# Patient Record
Sex: Female | Born: 1995 | Race: White | Hispanic: No | Marital: Single | Smoking: Never smoker
Health system: Southern US, Community
[De-identification: ages and names within clinical notes are randomized; demographics above are authoritative.]

## PROBLEM LIST (undated history)

## (undated) HISTORY — PX: OTHER SURGICAL HISTORY: SHX169

---

## 2015-03-01 ENCOUNTER — Ambulatory Visit (INDEPENDENT_AMBULATORY_CARE_PROVIDER_SITE_OTHER): Payer: Self-pay | Admitting: Family Medicine

## 2015-03-01 ENCOUNTER — Ambulatory Visit (INDEPENDENT_AMBULATORY_CARE_PROVIDER_SITE_OTHER): Payer: BLUE CROSS/BLUE SHIELD

## 2015-03-01 ENCOUNTER — Encounter: Payer: Self-pay | Admitting: Family Medicine

## 2015-03-01 VITALS — BP 132/83 | HR 82 | Ht 66.0 in | Wt 176.0 lb

## 2015-03-01 DIAGNOSIS — M25551 Pain in right hip: Secondary | ICD-10-CM | POA: Diagnosis not present

## 2015-03-01 NOTE — Progress Notes (Signed)
Taylor Salazar is a 19 y.o. female who presents to [facility]  today for right hip pain. Patient has a three-year history of right anterior hip pain. Pain is worse with activity especially hiking. She notes the hip flexion portion of hiking is quite painful. The pain is located in the anterior aspect of the hip. The pain does not radiate. She denies any significant locking or catching. She's tried some over-the-counter medicines for pain which helps some. No fevers chills nausea vomiting or diarrhea.   History reviewed. No pertinent past medical history. History reviewed. No pertinent past surgical history. History  Substance Use Topics  . Smoking status: Never Smoker   . Smokeless tobacco: Not on file  . Alcohol Use: Not on file   ROS as above Medications: Current Outpatient Prescriptions  Medication Sig Dispense Refill  . fexofenadine (ALLEGRA) 180 MG tablet Take 180 mg by mouth daily.    Marland Kitchen Fexofenadine HCl (ALLEGRA PO) Take by mouth.    . IRON PO Take by mouth.    . Potassium (POTASSIMIN PO) Take by mouth.     No current facility-administered medications for this visit.   Allergies  Allergen Reactions  . Penicillins Nausea And Vomiting     Exam:  BP 132/83 mmHg  Pulse 82  Ht  (1.676 m)  Wt 176 lb (79.833 kg)  BMI 28.42 kg/m2  LMP 01/29/2015 Gen: Well NAD HEENT: EOMI,  MMM Lungs: Normal work of breathing. CTABL Heart: RRR no MRG Abd: NABS, Soft. Nondistended, Nontender Exts: Brisk capillary refill, warm and well perfused.  MSK: Back nontender to midline normal range of motion Right hip normal-appearing tender palpation anterior aspect of the hip. Pain with hip range of motion especially flexion.  Range of motion  less than 5 limitation of flexion and external rotation compared to contralateral left side. Pain with flexion and external rotation. The locking or catching with hip range of motion. Strength is intact however pain with  resisted flexion  Limited musculoskeletal ultrasound of the right anterior hip: AIIS visualized. Hyperechoic change with increased Doppler activity present. Labrum visualize no obvious defects. Femoral head and neck visualized. Normal-appearing   No results found for this or any previous visit (from the past 24 hour(s)). Dg Hip Unilat With Pelvis 2-3 Views Right  03/01/2015   CLINICAL DATA:  Focal right hip pain for 2-3 years with no known injury  EXAM: DG HIP (WITH OR WITHOUT PELVIS) 2-3V RIGHT  COMPARISON:  None.  FINDINGS: No significant joint space narrowing or osteophyte formation. Normal rounded contour of femoral head with no abnormal attenuation. Pelvic bones intact.  IMPRESSION: Radiographically negative   Electronically Signed   By: Esperanza Heir M.D.   On: 03/01/2015 14:53     Please see individual assessment and plan sections.

## 2015-03-01 NOTE — Patient Instructions (Signed)
Thank you for coming in today. Get xray today.  Go to PT.  Return in 3-4 weeks for recheck.  Take up to 2 aleve twice daily for pain.

## 2015-03-01 NOTE — Progress Notes (Signed)
Quick Note:  Normal, no changes. ______ 

## 2015-03-01 NOTE — Assessment & Plan Note (Signed)
Great anterior hip pain chronically this point. At this point I suspect her pain is originating from the 8 IVS likely at the insertion of the rectus femoris. She has some change of the tendon insertion on ultrasound. Plan for physical therapy. If not better will consider MRI.

## 2015-03-09 ENCOUNTER — Encounter: Payer: Self-pay | Admitting: Rehabilitative and Restorative Service Providers"

## 2015-03-09 ENCOUNTER — Ambulatory Visit (INDEPENDENT_AMBULATORY_CARE_PROVIDER_SITE_OTHER): Payer: BLUE CROSS/BLUE SHIELD | Admitting: Rehabilitative and Restorative Service Providers"

## 2015-03-09 DIAGNOSIS — M25551 Pain in right hip: Secondary | ICD-10-CM | POA: Diagnosis not present

## 2015-03-09 DIAGNOSIS — R531 Weakness: Secondary | ICD-10-CM

## 2015-03-09 DIAGNOSIS — Z7409 Other reduced mobility: Secondary | ICD-10-CM | POA: Diagnosis not present

## 2015-03-09 DIAGNOSIS — M25651 Stiffness of right hip, not elsewhere classified: Secondary | ICD-10-CM

## 2015-03-09 DIAGNOSIS — R29898 Other symptoms and signs involving the musculoskeletal system: Secondary | ICD-10-CM | POA: Diagnosis not present

## 2015-03-09 NOTE — Therapy (Signed)
[facility] [place] [address] [place], [place], [address] Phone: [phone]   Fax:  [phone]  Physical Therapy Evaluation  Patient Details  Name: Taylor Salazar MRN: [id number] Date of Birth: [birth year] Referring Provider:  Rodolph Bong, MD  Encounter Date: 03/09/2015      PT End of Session - 03/09/15 1241    Visit Number 1   Number of Visits 12   Date for PT Re-Evaluation 04/20/15   PT Start Time 1101   PT Stop Time 1205   PT Time Calculation (min) 64 min   Activity Tolerance Patient tolerated treatment well;No increased pain      History reviewed. No pertinent past medical history.  History reviewed. No pertinent past surgical history.  There were no vitals filed for this visit.  Visit Diagnosis:  Hip pain, right - Plan: PT plan of care cert/re-cert  Stiffness of hip joint, right - Plan: PT plan of care cert/re-cert  Weakness of right hip - Plan: PT plan of care cert/re-cert  Decreased strength, endurance, and mobility - Plan: PT plan of care cert/re-cert      Subjective Assessment - 03/09/15 1109    Subjective Pain in the Rt anterior hip - only hurts with strenuous activities like walking for prolonged periods of time, hiking > 1/2 mile. Hurting since she went hiking last weekend. Has been a problem for ~ 3 years with no known cause   Pertinent History Mirgraines   How long can you sit comfortably? no limits   How long can you stand comfortably? 3 hours   How long can you walk comfortably? 20-30 min   Diagnostic tests xrays   Patient Stated Goals get rid of hip pain   Currently in Pain? Yes  pain anterior Rt hip with walking/hiking/lifting leg a lot  - resolves the following day but last episode lasted for ~1 week   Pain Score 0-No pain   Pain Location Hip   Pain Orientation Right   Pain Descriptors / Indicators Aching;Burning;Sharp;Stabbing   Pain Type Chronic pain   Aggravating Factors  tried adductor  squeeze at gym which irritated symptoms; hiking; walking; lifting leg   Pain Relieving Factors getting off leg            [facility] PT Assessment - 03/09/15 0001    Assessment   Medical Diagnosis Rt hip pain   Onset Date/Surgical Date --  ~3 years ago with episodic flare ups most recently ~ 1 week   Hand Dominance Right   Next MD Visit no return scheduled   Precautions   Precaution Comments avoid activities that cause pain   Balance Screen   Has the patient fallen in the past 6 months No   Has the patient had a decrease in activity level because of a fear of falling?  No   Is the patient reluctant to leave their home because of a fear of falling?  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   Leisure hiking; biking; gym    Observation/Other Assessments   Observations sits flexed forward with legs crossed Rt over Lt   Sensation   Additional Comments WNL's per patient report   Posture/Postural Control   Posture Comments  stands with knees hyperextended;   AROM   Right/Left Hip --  WFL except Rt tight hip flexors (-)20 deg supine testing   Lumbar Flexion 80%   Lumbar Extension 80%   Lumbar - Right Side Bend 80%   Lumbar - Left Side Bend 75%   Strength   Right/Left Hip --  Lt LE 5/5; Rt knee 5/5   Right Hip Flexion 4+/5   Right Hip Extension 4+/5   Right Hip ABduction 5/5   Right Hip ADduction 4+/5   Palpation   Palpation comment muscular tenderness/tightness through Rt anterior hip through hip flexors/adductors                    [facility] Adult PT Treatment/Exercise - 03/09/15 0001    Self-Care   Self-Care --  instructed in myofacial ball release work Rt hip anter/post   Knee/Hip Exercises: Dentist 3  reps;20 seconds   Hip Flexor Stretch 3 reps;20 seconds   Hip Flexor Stretch Limitations prone with hip extended using strap   Other Knee/Hip Stretches supine hip flexor stretch 60 sec hold 3 reps   Knee/Hip Exercises: Prone   Other Prone Exercises glut set 10 sec hold 10 reps   Cryotherapy   Number Minutes Cryotherapy 15 Minutes   Cryotherapy Location Hip   Type of Cryotherapy Ice pack                PT Education - 03/09/15 1212    Education provided Yes   Education Details Muscular tightness and dysfunction; nature of chronic musculoskeletal problems; stretching; myofacial ball release work   [employer]) Educated Patient   Methods Explanation;Demonstration;Verbal cues;Tactile cues;Handout   Comprehension Verbalized understanding;Returned demonstration;Verbal cues required;Tactile cues required             PT Long Term Goals - 03/09/15 1247    PT LONG TERM GOAL #1   Title Patient I in HEP for discharge 04/20/15   Time 6   Period Weeks   Status New   PT LONG TERM GOAL #2   Title Decrease pain in Rt hip allowing patient to hike for 1 hour with min/no symptoms 04/20/15   Time 6   Period Weeks   Status New   PT LONG TERM GOAL #3   Title Increase Rt hip extension to neutral with supine test position 04/20/15   Time 6   Period Weeks   Status New   PT LONG TERM GOAL #4   Title Increase Rt hip strength to 5/5 04/20/15   Time 6   Period Weeks   Status New   PT LONG TERM GOAL #5   Title Decrease FOTO to </= 24% limitatiion 04/20/15   Time 6   Period Weeks   Status New               Plan - 03/09/15 1242    Clinical Impression Statement Patient presents with chronic Rt hip pain for over 3 years of unknown etiology with symptoms increased in the past 1-2 weeks. She has pain with functional activities; decreased Rt hip extension; decreased Rt hip strength; pain with palpation through anterior hip structures. [name] will benefit from Physical Therapy to decrease  symptoms and establish a home program.   Pt will benefit from skilled therapeutic intervention in order to improve on the following deficits Pain;Decreased range of motion;Decreased strength;Decreased mobility;Decreased activity tolerance   Rehab Potential Good   PT Frequency 2x / week   PT Duration  6 weeks   PT Treatment/Interventions Patient/family education;ADLs/Self Care Home Management;Therapeutic exercise;Therapeutic activities;Neuromuscular re-education;Cryotherapy;Electrical Stimulation;Moist Heat;Ultrasound;Manual techniques   PT Next Visit Plan review exercises; add manual work through anterior hip structures; add hip adductor stretch   PT Home Exercise Plan stretching; myofacila ball release work   Financial planner with Plan of Care Patient         Problem List Patient Active Problem List   Diagnosis Date Noted  . Right hip pain 03/01/2015    Celyn Rober Minion, PT, MPH 03/09/2015, 12:55 PM  [facility] [address] [place], [place], [address] Phone: [phone]   Fax:  [phone]

## 2015-03-09 NOTE — Patient Instructions (Signed)
Myofascial release work with balls - 2-4 inch rubber balls On stomach and on back. 5-10 min several times/day   Gluteal Sets   Tighten buttocks while pressing pelvis to floor. Hold _10___ seconds. Repeat _10___ times per set. Do _1-3___ sets per session. Do _2-3___ sessions per day.    Marland Kitchen  KNEE: Quadriceps - Prone   Place strap around ankle. Bring ankle toward buttocks.  Hold _20__ seconds. _3__ reps per set, ___2-3 per day   Progress quad stretch by placing foam roll under lower thigh and repeat as above.   Quads / HF, Supine   Lie near edge of bed, pull both knees to chest. Hold left knee to chest and lower right leg off the edge, relaxed,  (Bend hanging knee backward keeping thigh in contact with bed.) Hold _60__ seconds.  Repeat _3-4__ times per session. Do _3-5__ sessions per day.  Sleep with pillow between knees.

## 2015-03-12 ENCOUNTER — Encounter: Payer: Self-pay | Admitting: Rehabilitative and Restorative Service Providers"

## 2015-03-12 ENCOUNTER — Ambulatory Visit (INDEPENDENT_AMBULATORY_CARE_PROVIDER_SITE_OTHER): Payer: BLUE CROSS/BLUE SHIELD | Admitting: Rehabilitative and Restorative Service Providers"

## 2015-03-12 DIAGNOSIS — M25551 Pain in right hip: Secondary | ICD-10-CM | POA: Diagnosis not present

## 2015-03-12 DIAGNOSIS — M25651 Stiffness of right hip, not elsewhere classified: Secondary | ICD-10-CM

## 2015-03-12 DIAGNOSIS — R531 Weakness: Secondary | ICD-10-CM

## 2015-03-12 DIAGNOSIS — R29898 Other symptoms and signs involving the musculoskeletal system: Secondary | ICD-10-CM

## 2015-03-12 DIAGNOSIS — R6889 Other general symptoms and signs: Secondary | ICD-10-CM

## 2015-03-12 DIAGNOSIS — Z7409 Other reduced mobility: Secondary | ICD-10-CM | POA: Diagnosis not present

## 2015-03-12 NOTE — Therapy (Signed)
[facility] [place] [address] [place], [place], [address] Phone: [phone]   Fax:  [phone]  Physical Therapy Treatment  Patient Details  Name: Taylor Salazar MRN: [id number] Date of Birth: 1996-01-06 Referring Provider:  Rodolph Bong, MD  Encounter Date: 03/12/2015      PT End of Session - 03/12/15 0814    Visit Number 2   Number of Visits 12   Date for PT Re-Evaluation 04/20/15   PT Start Time 0804   PT Stop Time 0858   PT Time Calculation (min) 54 min   Activity Tolerance Patient tolerated treatment well;No increased pain      History reviewed. No pertinent past medical history.  History reviewed. No pertinent past surgical history.  There were no vitals filed for this visit.  Visit Diagnosis:  Hip pain, right  Stiffness of hip joint, right  Weakness of right hip  Decreased strength, endurance, and mobility      Subjective Assessment - 03/12/15 0806    Subjective Working on her exercises at home and using the ball for myofacial release work as often as she can. Difficult when she is not home. No significant changes in symptoms.    Pain Score 0-No pain                         [facility] Adult PT Treatment/Exercise - 03/12/15 0001    Knee/Hip Exercises: Stretches   Quad Stretch 3 reps;20 seconds   Hip Flexor Stretch 3 reps;20 seconds   Other Knee/Hip Stretches supine hip flexor stretch 60 sec hold 3 reps   Other Knee/Hip Stretches hip adductor stretch and sartarosis stretch both supine 20-30 sec hold 3 reps ea; hip flexor stretch in half kneeling and standing 30-45 sec hold 2 reps ea   Moist Heat Therapy   Number Minutes Moist Heat 15 Minutes   Moist Heat Location Hip  anterior   Manual Therapy   Soft tissue mobilization soft tissue mobilization working through the hip flexors and adductors through patient's shorts with pt in supine position   Myofascial Release slow maintained stretch for  hip flexors/dductors/sartarosis with PT assist                PT Education - 03/12/15 0855    Education Details education re stretching and added specific stretches for home   Person(s) Educated Patient   Methods Explanation;Demonstration;Tactile cues;Verbal cues;Handout   Comprehension Verbalized understanding;Returned demonstration;Verbal cues required;Tactile cues required             PT Long Term Goals - 03/12/15 0818    PT LONG TERM GOAL #1   Title Patient I in HEP for discharge 04/20/15   Time 6   Period Weeks   Status On-going   PT LONG TERM GOAL #2   Title Decrease pain in Rt hip allowing patient to hike for 1 hour with min/no symptoms 04/20/15   Time 6   Period Weeks   Status On-going   PT LONG TERM GOAL #3   Title Increase Rt hip extension to neutral with supine test position 04/20/15   Time 6   Period Weeks   Status On-going   PT LONG TERM GOAL #4   Title Increase Rt hip strength to 5/5 04/20/15   Time 6   Period Weeks   Status On-going               Plan - 03/12/15 6962  Clinical Impression Statement Patient reports that she is working on her exercises at home. She has significant tightness through Rt hip flexors. Responding well to stretching and soft tissue.   Pt will benefit from skilled therapeutic intervention in order to improve on the following deficits Pain;Decreased range of motion;Decreased strength;Decreased mobility;Decreased activity tolerance   Rehab Potential Good   PT Frequency 2x / week   PT Duration 6 weeks   PT Treatment/Interventions Patient/family education;ADLs/Self Care Home Management;Therapeutic exercise;Therapeutic activities;Neuromuscular re-education;Cryotherapy;Electrical Stimulation;Moist Heat;Ultrasound;Manual techniques   PT Next Visit Plan review exercises; add manual work through anterior hip structures; add hip adductor stretch   PT Home Exercise Plan stretching; myofacial ball release work   Manufacturing systems engineer with Plan of Care Patient        Problem List Patient Active Problem List   Diagnosis Date Noted  . Right hip pain 03/01/2015    Alexzandra Bilton Rober Minion, PT, MPH 03/12/2015, 9:01 AM  [facility] [address] [place], [place], [address] Phone: [phone]   Fax:  [phone]

## 2015-03-12 NOTE — Patient Instructions (Addendum)
BACK: Hip Flexor Stretch   Interlace fingers on top of right knee. Shift weight forward.Pelvic tilt. Keep back straight. Hold 20-30 sec relax back and repeat. 3-2 reps   Standing  Hips against counter. Reach back with Rt leg keeping foot on the ground and straighten knee hold 20-30 sec repeat as needed    [place] stretch  Lying on bed. Bend hips and knees up toward chest. Hold left knee to chest. Lower right leg off bed. Have help to gently pull right foot away from your body.   Lying on bed - bring right leg out to the side hold 1-2 min with leg resting on bed or floor.   CAREGIVER ASSISTED: Adductors   Caregiver moves leg out to side, keeping toes pointed up. Hold _30__ seconds. 3___ reps per set 2-3 times per day

## 2015-03-16 ENCOUNTER — Ambulatory Visit (INDEPENDENT_AMBULATORY_CARE_PROVIDER_SITE_OTHER): Payer: BLUE CROSS/BLUE SHIELD | Admitting: Rehabilitative and Restorative Service Providers"

## 2015-03-16 ENCOUNTER — Encounter: Payer: Self-pay | Admitting: Rehabilitative and Restorative Service Providers"

## 2015-03-16 DIAGNOSIS — R29898 Other symptoms and signs involving the musculoskeletal system: Secondary | ICD-10-CM | POA: Diagnosis not present

## 2015-03-16 DIAGNOSIS — M25551 Pain in right hip: Secondary | ICD-10-CM

## 2015-03-16 DIAGNOSIS — Z7409 Other reduced mobility: Secondary | ICD-10-CM | POA: Diagnosis not present

## 2015-03-16 DIAGNOSIS — M25651 Stiffness of right hip, not elsewhere classified: Secondary | ICD-10-CM | POA: Diagnosis not present

## 2015-03-16 DIAGNOSIS — R531 Weakness: Secondary | ICD-10-CM

## 2015-03-16 DIAGNOSIS — R6889 Other general symptoms and signs: Secondary | ICD-10-CM

## 2015-03-16 NOTE — Therapy (Signed)
[facility] [place] [address] [place], [place], [address] Phone: [phone]   Fax:  [phone]  Physical Therapy Treatment  Patient Details  Name: Taylor Salazar MRN: [id number] Date of Birth: [birth year] Referring Provider:  Rodolph Bong, MD  Encounter Date: 03/16/2015      PT End of Session - 03/16/15 0826    Visit Number 3   Number of Visits 12   Date for PT Re-Evaluation 04/20/15   PT Start Time 0803   PT Stop Time 0856   PT Time Calculation (min) 53 min   Activity Tolerance Patient tolerated treatment well;No increased pain      History reviewed. No pertinent past medical history.  History reviewed. No pertinent past surgical history.  There were no vitals filed for this visit.  Visit Diagnosis:  Hip pain, right  Stiffness of hip joint, right  Weakness of right hip  Decreased strength, endurance, and mobility      Subjective Assessment - 03/16/15 0809    Subjective Hip is generally doing better but did "give out" on her Saturday while she was at work. May be because she did not have s chance to stretch Saturday. Not sure of any possible cause of increase in symptoms. Lasted a few hours but resolved when she got home and stretched. Concerned with walking when she heads to school Monday([employer]).   Currently in Pain? No/denies                         [facility] Adult PT Treatment/Exercise - 03/16/15 0001    Knee/Hip Exercises: Stretches   Quad Stretch 3 reps;20 seconds   Hip Flexor Stretch 3 reps;20 seconds   Other Knee/Hip Stretches supine hip flexor stretch 60 sec hold 3 reps   Other Knee/Hip Stretches hip adductor stretch and sartarosis stretch both supine 20-30 sec hold 3 reps ea; hip flexor stretch in half kneeling and standing 30-45 sec hold 2 reps ea   Knee/Hip Exercises: Supine   Bridges Limitations 10 reps 10 sec hold with core engaged   Other Supine Knee/Hip Exercises 3 part core 10 sec  hold 10 reps   Moist Heat Therapy   Number Minutes Moist Heat 15 Minutes   Moist Heat Location Hip  anterior   Electrical Stimulation   Electrical Stimulation Location hip flexors/adductors   Electrical Stimulation Action IFC   Electrical Stimulation Parameters to tolerance   Electrical Stimulation Goals Pain   Manual Therapy   Soft tissue mobilization soft tissue mobilization working through the hip flexors and adductors through patient's shorts with pt in supine position   Myofascial Release slow maintained stretch for hip flexors/dductors/sartarosis with PT assist                PT Education - 03/16/15 0825    Education provided Yes   Education Details to be consistent with stretching; add new exercises; begin some walking.   Person(s) Educated Patient   Methods Explanation;Demonstration;Tactile cues;Verbal cues;Handout   Comprehension Verbalized understanding;Returned demonstration;Verbal cues required;Tactile cues required             PT Long Term Goals - 03/12/15 0818    PT LONG TERM GOAL #1   Title Patient I in HEP for discharge 04/20/15   Time 6   Period Weeks   Status On-going   PT LONG TERM GOAL #2   Title Decrease pain in Rt hip allowing patient to hike for 1 hour with min/no  symptoms 04/20/15   Time 6   Period Weeks   Status On-going   PT LONG TERM GOAL #3   Title Increase Rt hip extension to neutral with supine test position 04/20/15   Time 6   Period Weeks   Status On-going   PT LONG TERM GOAL #4   Title Increase Rt hip strength to 5/5 04/20/15   Time 6   Period Weeks   Status On-going               Plan - 03/16/15 0844    Clinical Impression Statement Overall improvement - less tightness in the anterior hip flexors - continued palpable tightness through the sartorius and hip abductors.    Pt will benefit from skilled therapeutic intervention in order to improve on the following deficits Pain;Decreased range of motion;Decreased  strength;Decreased mobility;Decreased activity tolerance   Rehab Potential Good   PT Frequency 2x / week   PT Treatment/Interventions Patient/family education;ADLs/Self Care Home Management;Therapeutic exercise;Therapeutic activities;Neuromuscular re-education;Cryotherapy;Electrical Stimulation;Moist Heat;Ultrasound;Manual techniques   PT Next Visit Plan review exercises; continue manual work through anterior hip structures and hip adductors   PT Home Exercise Plan stretching; myofacial ball release work   Financial planner with Plan of Care Patient        Problem List Patient Active Problem List   Diagnosis Date Noted  . Right hip pain 03/01/2015    Braddock Servellon Rober Minion, PT, MPH 03/16/2015, 8:49 AM  [facility] [address] [place], [place], [address] Phone: [phone]   Fax:  [phone]

## 2015-03-16 NOTE — Patient Instructions (Signed)
  Abdominal Bracing With Pelvic Floor (Hook-Lying)   With neutral spine, tighten pelvic floor and abdominals, tighten muscles in low back at waist.  Hold 10 sec. Repeat __10_ times. Do __several_ times a day. Progress to do this exercise in sitting, standing and walking    bridging .   Lie back, do 3 part core(above) legs bent. Inhale, pressing hips up. Hold 10 sec Repeat _10___ times. Do __10__ sessions per day.

## 2015-03-19 ENCOUNTER — Ambulatory Visit (INDEPENDENT_AMBULATORY_CARE_PROVIDER_SITE_OTHER): Payer: BLUE CROSS/BLUE SHIELD | Admitting: Rehabilitative and Restorative Service Providers"

## 2015-03-19 ENCOUNTER — Encounter: Payer: Self-pay | Admitting: Rehabilitative and Restorative Service Providers"

## 2015-03-19 DIAGNOSIS — M25551 Pain in right hip: Secondary | ICD-10-CM | POA: Diagnosis not present

## 2015-03-19 DIAGNOSIS — M25651 Stiffness of right hip, not elsewhere classified: Secondary | ICD-10-CM

## 2015-03-19 DIAGNOSIS — R29898 Other symptoms and signs involving the musculoskeletal system: Secondary | ICD-10-CM

## 2015-03-19 DIAGNOSIS — R531 Weakness: Secondary | ICD-10-CM

## 2015-03-19 DIAGNOSIS — Z7409 Other reduced mobility: Secondary | ICD-10-CM | POA: Diagnosis not present

## 2015-03-19 NOTE — Therapy (Signed)
[facility] [address] [place], [place], [address] Phone: [phone]   Fax:  [phone]  Physical Therapy Treatment  Patient Details  Name: Taylor Salazar MRN: [id number] Date of Birth: Aug 02, 1995 Referring Provider:  Gregor Hams, MD  Encounter Date: 03/19/2015      PT End of Session - 03/19/15 0831    Visit Number 4   Number of Visits 12   Date for PT Re-Evaluation 04/20/15   PT Start Time 0815      History reviewed. No pertinent past medical history.  History reviewed. No pertinent past surgical history.  There were no vitals filed for this visit.  Visit Diagnosis:  Hip pain, right  Stiffness of hip joint, right  Weakness of right hip  Decreased strength, endurance, and mobility      Subjective Assessment - 03/19/15 0823    Subjective Patient reports that she is doing OK. Walked around campus yesterday and had some discomfort but not like she has experienced in the past. Pleased with progress overall. Overallnotes good improvement. Pleaed with progress. Knows changes will take time and that she must continue working on her stretches.    Currently in Pain? No/denies            [facility] PT Assessment - 03/19/15 0001    Observation/Other Assessments   Focus on Therapeutic Outcomes (FOTO)  Intake 37% limitatioin; today 21% limitation   AROM   Lumbar Flexion 90%   Lumbar Extension 85%   Lumbar - Right Side Bend 85%   Lumbar - Left Side Bend 85%   Strength   Right Hip Flexion --  5-/5   Right Hip Extension 5/5   Right Hip ABduction 5/5   Right Hip ADduction --  5-/5   Palpation   Palpation comment decreased muscular tenderness/tightness through Rt anterior hip through hip flexors/adductors                      [facility] Adult PT Treatment/Exercise - 03/19/15 0001    Lumbar Exercises: Quadruped   Opposite Arm/Leg Raise 10 reps;2 seconds   Knee/Hip Exercises: Stretches   Sports administrator 3  reps;20 seconds   Hip Flexor Stretch 3 reps;20 seconds   Other Knee/Hip Stretches supine hip flexor stretch 60 sec hold 3 reps   Other Knee/Hip Stretches hip adductor stretch and sartarosis stretch both supine 20-30 sec hold 3 reps ea; hip flexor stretch in half kneeling and standing 30-45 sec hold 2 reps ea   Knee/Hip Exercises: Supine   Bridges Limitations 10 reps 10 sec hold with core engaged   Other Supine Knee/Hip Exercises 3 part core 10 sec hold 10 reps   Other Supine Knee/Hip Exercises High kneeling hip flexor stretch    Moist Heat Therapy   Number Minutes Moist Heat 15 Minutes   Moist Heat Location Hip   Electrical Stimulation   Electrical Stimulation Location hip flexors/adductors   Electrical Stimulation Action IFC   Electrical Stimulation Parameters to tolerance   Electrical Stimulation Goals Pain   Manual Therapy   Soft tissue mobilization soft tissue mobilization working through the hip flexors and adductors through patient's shorts with pt in supine position   Myofascial Release slow maintained stretch for hip flexors/dductors/sartarosis with PT assist                PT Education - 03/19/15 0835    Education provided Yes   Education Details Added stretching and strengthening; encouraged continued stretching  frequently and consistently. Patient to call with any questioins.    Person(s) Educated Patient   Methods Explanation;Demonstration;Tactile cues;Verbal cues;Handout   Comprehension Verbalized understanding;Returned demonstration;Verbal cues required;Tactile cues required             PT Long Term Goals - 03/19/15 1149    PT LONG TERM GOAL #1   Title Patient I in HEP for discharge 04/20/15   Time 6   Period Weeks   Status Achieved   PT LONG TERM GOAL #2   Title Decrease pain in Rt hip allowing patient to hike for 1 hour with min/no symptoms 04/20/15   Time 6   Period Weeks   Status Achieved   PT LONG TERM GOAL #3   Title Increase Rt hip  extension to neutral with supine test position 04/20/15   Time 6   Period Weeks   Status Achieved   PT LONG TERM GOAL #4   Title Increase Rt hip strength to 5/5 04/20/15   Time 6   Period Weeks   Status Achieved   PT LONG TERM GOAL #5   Title Decrease FOTO to </= 24% limitatiion 04/20/15   Time 6   Period Weeks   Status Achieved               Plan - 03/19/15 1251    Clinical Impression Statement Progressing well with good gains in mobility and function. Patient will be returning to school Monday and unable to continue therapy. She will continue with I HEP and call with any questions or concerns.    Pt will benefit from skilled therapeutic intervention in order to improve on the following deficits Pain;Decreased range of motion;Decreased strength;Decreased mobility;Decreased activity tolerance   Rehab Potential Good   PT Frequency 2x / week   PT Duration 6 weeks   PT Treatment/Interventions Patient/family education;ADLs/Self Care Home Management;Therapeutic exercise;Therapeutic activities;Neuromuscular re-education;Cryotherapy;Electrical Stimulation;Moist Heat;Ultrasound;Manual techniques   PT Next Visit Plan D/C to I HEP/home management   PT Home Exercise Plan stretching; strengthening; myofacial ball release work   Consulted and Agree with Plan of Care Patient        Problem List Patient Active Problem List   Diagnosis Date Noted  . Right hip pain 03/01/2015    Azzure Garabedian Nilda Simmer, PT, MPH 03/19/2015, 1:00 PM  [facility] [place] [address] [place], [place], [address] Phone: [phone]   Fax:  [phone]   PHYSICAL THERAPY DISCHARGE SUMMARY  Visits from Start of Care: 4  Current functional level related to goals / functional outcomes: Good improvement in symptoms. To continue stretching/strengthening   Remaining deficits: Muscular tightness through hip flexors/adductors - significantly improved but remains  tight. Patient needs to continue with consistent stretching.    Education / Equipment: HEP - has a TENS unit at home.   Plan: Patient agrees to discharge.  Patient goals were met. Patient is being discharged due to meeting the stated rehab goals.  ?????

## 2015-03-19 NOTE — Patient Instructions (Addendum)
Quads / HF, Lunge   Kneel in deep lunge, behind leg on floor. Tip pelvis forward until stretch is felt on front of hip. Hold __30_ seconds. Repeat __3_ times per session. Do _2-3__ sessions per day.   Upper / Lower Extremity Extension (All-Fours)   Tighten stomach and raise right leg and opposite arm. Keep trunk rigid. Repeat __10__ times per set. Do _1-2___ sets per session. Do _1___ sessions per day.

## 2015-05-29 ENCOUNTER — Encounter: Payer: Self-pay | Admitting: Emergency Medicine

## 2015-05-29 ENCOUNTER — Emergency Department (INDEPENDENT_AMBULATORY_CARE_PROVIDER_SITE_OTHER)
Admission: EM | Admit: 2015-05-29 | Discharge: 2015-05-29 | Disposition: A | Discharge status: Home / Self Care | Payer: BLUE CROSS/BLUE SHIELD | Source: Home / Self Care | Attending: Family Medicine | Admitting: Family Medicine

## 2015-05-29 DIAGNOSIS — J029 Acute pharyngitis, unspecified: Secondary | ICD-10-CM

## 2015-05-29 LAB — POCT MONO SCREEN (KUC): Mono, POC: NEGATIVE

## 2015-05-29 LAB — POCT INFLUENZA A/B
Influenza A, POC: NEGATIVE
Influenza B, POC: NEGATIVE

## 2015-05-29 LAB — POCT RAPID STREP A (OFFICE): Rapid Strep A Screen: NEGATIVE

## 2015-05-29 MED ORDER — IBUPROFEN 600 MG PO TABS: 600.0000 mg | ORAL_TABLET | Freq: Four times a day (QID) | ORAL | Status: AC | PRN

## 2015-05-29 MED ORDER — MAGIC MOUTHWASH W/LIDOCAINE: 5.0000 mL | Freq: Three times a day (TID) | ORAL | Status: AC | PRN

## 2015-05-29 NOTE — ED Provider Notes (Signed)
CSN: [id number]     Arrival date & time 05/29/15  1212 History   First MD Initiated Contact with Patient 05/29/15 1214     Chief Complaint  Patient presents with  . Sore Throat   (Consider location/radiation/quality/duration/timing/severity/associated sxs/prior Treatment) HPI Pt is a 19yo female presenting to [facility] with c/o sudden onset sore throat and fever of 100.3 F, associated nausea and vomiting once today, as well as body aches.  Symptoms started yesterday with a sore throat. Pain is aching, mild to moderate, worse with speaking and swallowing but she is able to keep down fluids.  She reports a mild intermittent non-productive cough.  She notes her 17yo brother was just dx with hand foot and mouth disease. No recent travel. She has not taken any OTC medications this morning. Denies chest pain or SOB. She has not received the flu vaccine this year.   History reviewed. No pertinent past medical history. Past Surgical History  Procedure Laterality Date  . Migraine     Family History  Problem Relation Age of Onset  . Anxiety disorder Mother   . Bipolar disorder Father    Social History  Substance Use Topics  . Smoking status: Never Smoker   . Smokeless tobacco: Never Used  . Alcohol Use: No   OB History    No data available     Review of Systems  Constitutional: Positive for fever and chills.  HENT: Positive for sore throat. Negative for congestion, ear pain, trouble swallowing and voice change.   Respiratory: Negative for cough and shortness of breath.   Gastrointestinal: Positive for nausea and vomiting. Negative for abdominal pain, diarrhea and constipation.  Musculoskeletal: Positive for myalgias and arthralgias. Negative for back pain.    Allergies  Penicillins  Home Medications   Prior to Admission medications   Medication Sig Start Date End Date Taking? Authorizing Provider  fexofenadine (ALLEGRA) 180 MG tablet Take 180 mg by mouth daily.    Historical Provider,  MD  Fexofenadine HCl (ALLEGRA PO) Take by mouth.    Historical Provider, MD  ibuprofen (ADVIL,MOTRIN) 600 MG tablet Take 1 tablet (600 mg total) by mouth every 6 (six) hours as needed. 05/29/15   Junius Finner, PA-C  IRON PO Take by mouth.    Historical Provider, MD  magic mouthwash w/lidocaine SOLN Take 5 mLs by mouth 3 (three) times daily as needed for mouth pain. Gargle, swish and spit 05/29/15   Junius Finner, PA-C  Potassium (POTASSIMIN PO) Take by mouth.    Historical Provider, MD   Meds Ordered and Administered this Visit  Medications - No data to display  BP 126/84 mmHg  Pulse 112  Temp(Src) 98.3 F (36.8 C) (Oral)  Wt 182 lb (82.555 kg)  SpO2 97%  LMP 05/05/2015 No data found.   Physical Exam  Constitutional: She appears well-developed and well-nourished. No distress.  HENT:  Head: Normocephalic and atraumatic.  Right Ear: Hearing, tympanic membrane, external ear and ear canal normal.  Left Ear: Hearing, tympanic membrane, external ear and ear canal normal.  Nose: Nose normal.  Mouth/Throat: Uvula is midline and mucous membranes are normal. Posterior oropharyngeal edema and posterior oropharyngeal erythema present. No oropharyngeal exudate or tonsillar abscesses.  Scant red blood on bilateral tonsils. No peritonsillar abscess. Airway clear.   Eyes: Conjunctivae are normal. No scleral icterus.  Neck: Normal range of motion. Neck supple.  Cardiovascular: Regular rhythm and normal heart sounds.  Tachycardia present.   Pulmonary/Chest: Effort normal and breath sounds normal.  No respiratory distress. She has no wheezes. She has no rales. She exhibits no tenderness.  Abdominal: Soft. She exhibits no distension and no mass. There is no tenderness. There is no rebound and no guarding.  Musculoskeletal: Normal range of motion.  Lymphadenopathy:    She has no cervical adenopathy.  Neurological: She is alert.  Skin: Skin is warm and dry. No rash noted. She is not diaphoretic. No  erythema.  Nursing note and vitals reviewed.   ED Course  Procedures (including critical care time)  Labs Review Labs Reviewed  POCT RAPID STREP A (OFFICE)  POCT INFLUENZA A/B  POCT MONO SCREEN ([facility])    Imaging Review No results found.   MDM   1. Acute pharyngitis, unspecified etiology     Pt c/o sore throat and body aches with 1 episode of vomiting earlier this morning.  Contact with her 17yo brother with hand foot and mouth disease. No airway obstructions. Mild tachycardia.  Rapid strep: negative. Will send culture.  Mono and flu also negative.   Rx: magic mouthwash and ibuprofen. Advised parents to use acetaminophen and ibuprofen as needed for fever and pain. Encouraged rest and fluids.  F/u with PCP in 4-5 days if not improving, sooner if worsening. Pt verbalized understanding and agreement with tx plan.     Junius Finnerrin O'Malley, PA-C 05/29/15 [phone]

## 2015-05-29 NOTE — ED Notes (Signed)
Pt  C/o sore throat, fever, vomiting and body aches that started yesterday. States brother dx with hands, feet and mouth.

## 2015-06-01 ENCOUNTER — Telehealth: Payer: Self-pay | Admitting: *Deleted

## 2017-07-17 IMAGING — CR DG HIP (WITH OR WITHOUT PELVIS) 2-3V*R*
3 series · 3 of 3 positions shown · non-contrast
Comparison: None.

CLINICAL DATA: Focal right hip pain for 2-3 years with no known
injury

EXAM:
DG HIP (WITH OR WITHOUT PELVIS) 2-3V RIGHT

[pelvis ap]
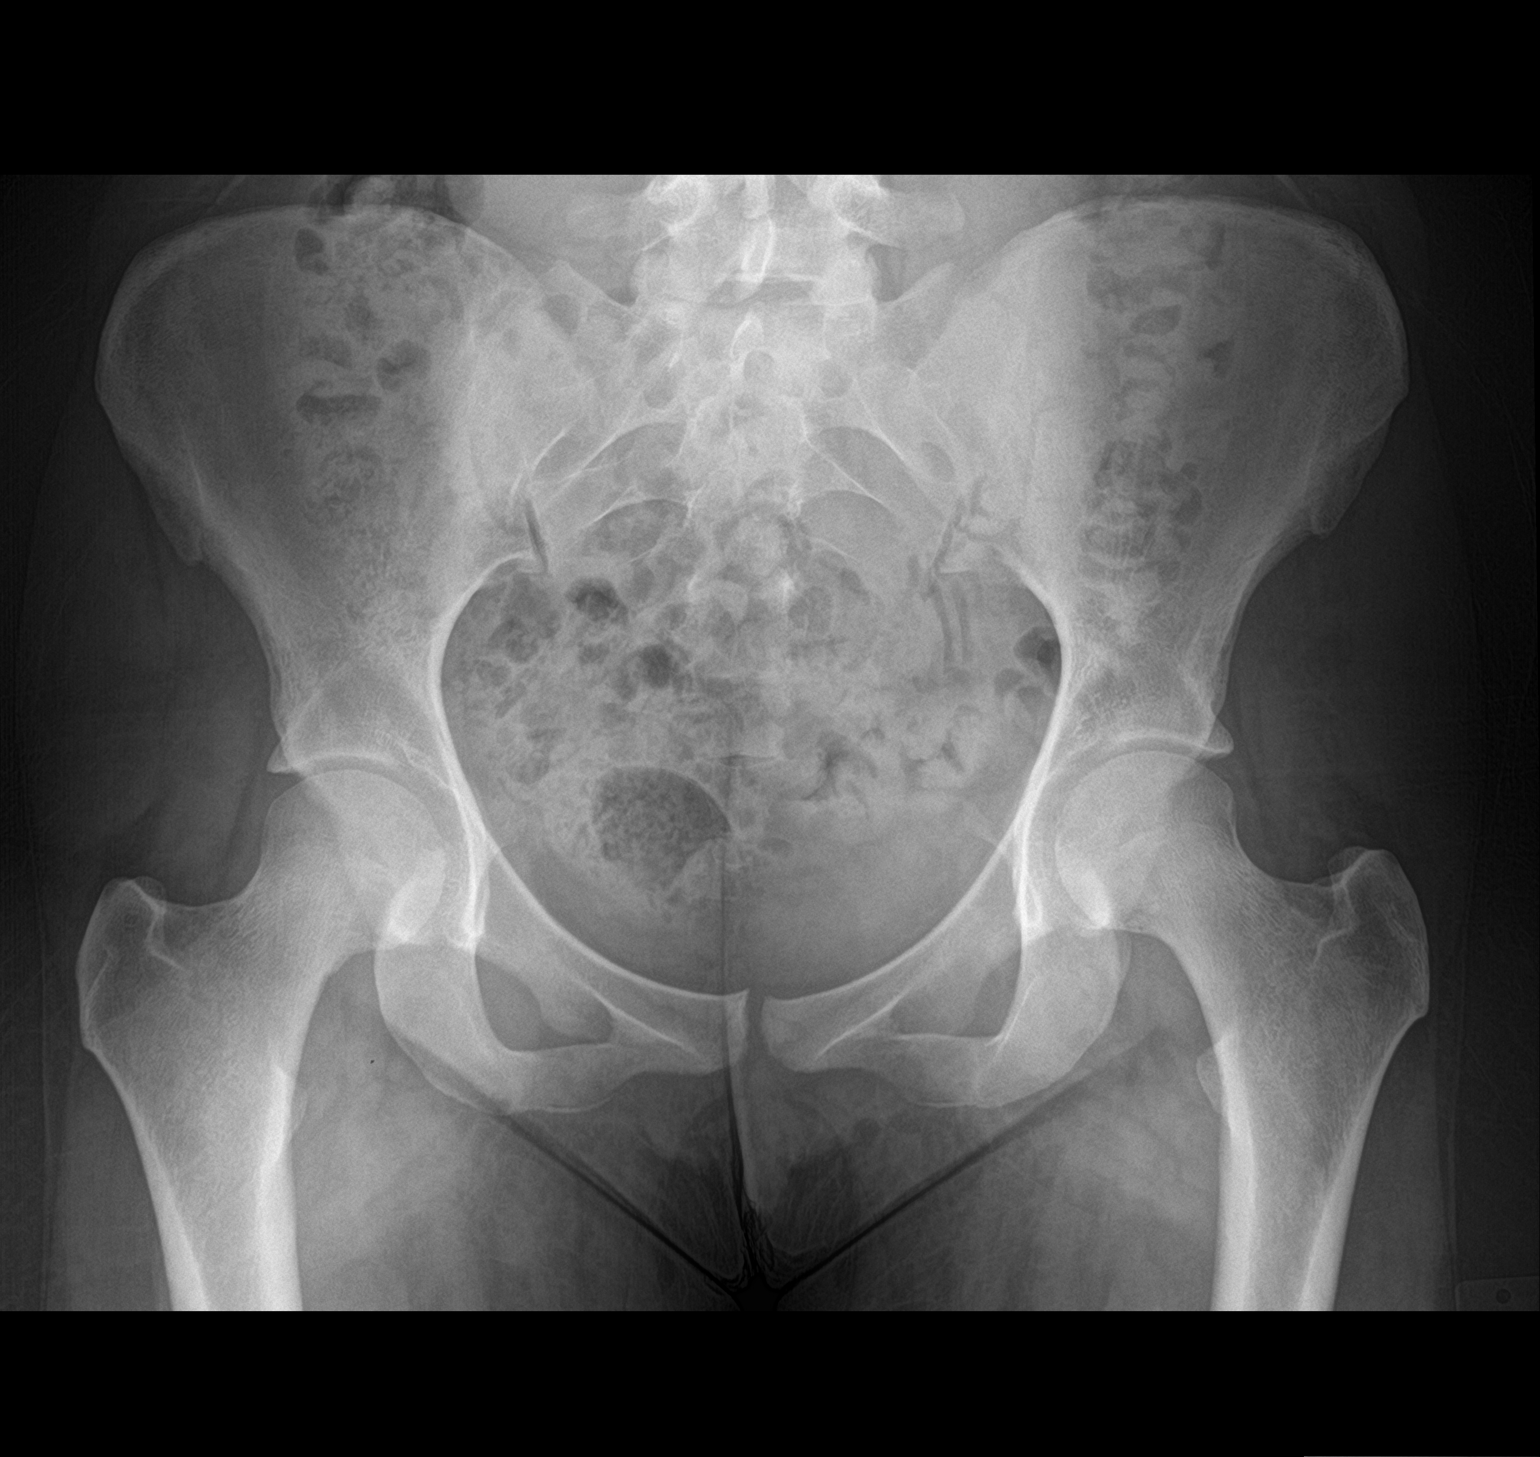

[hip ap]
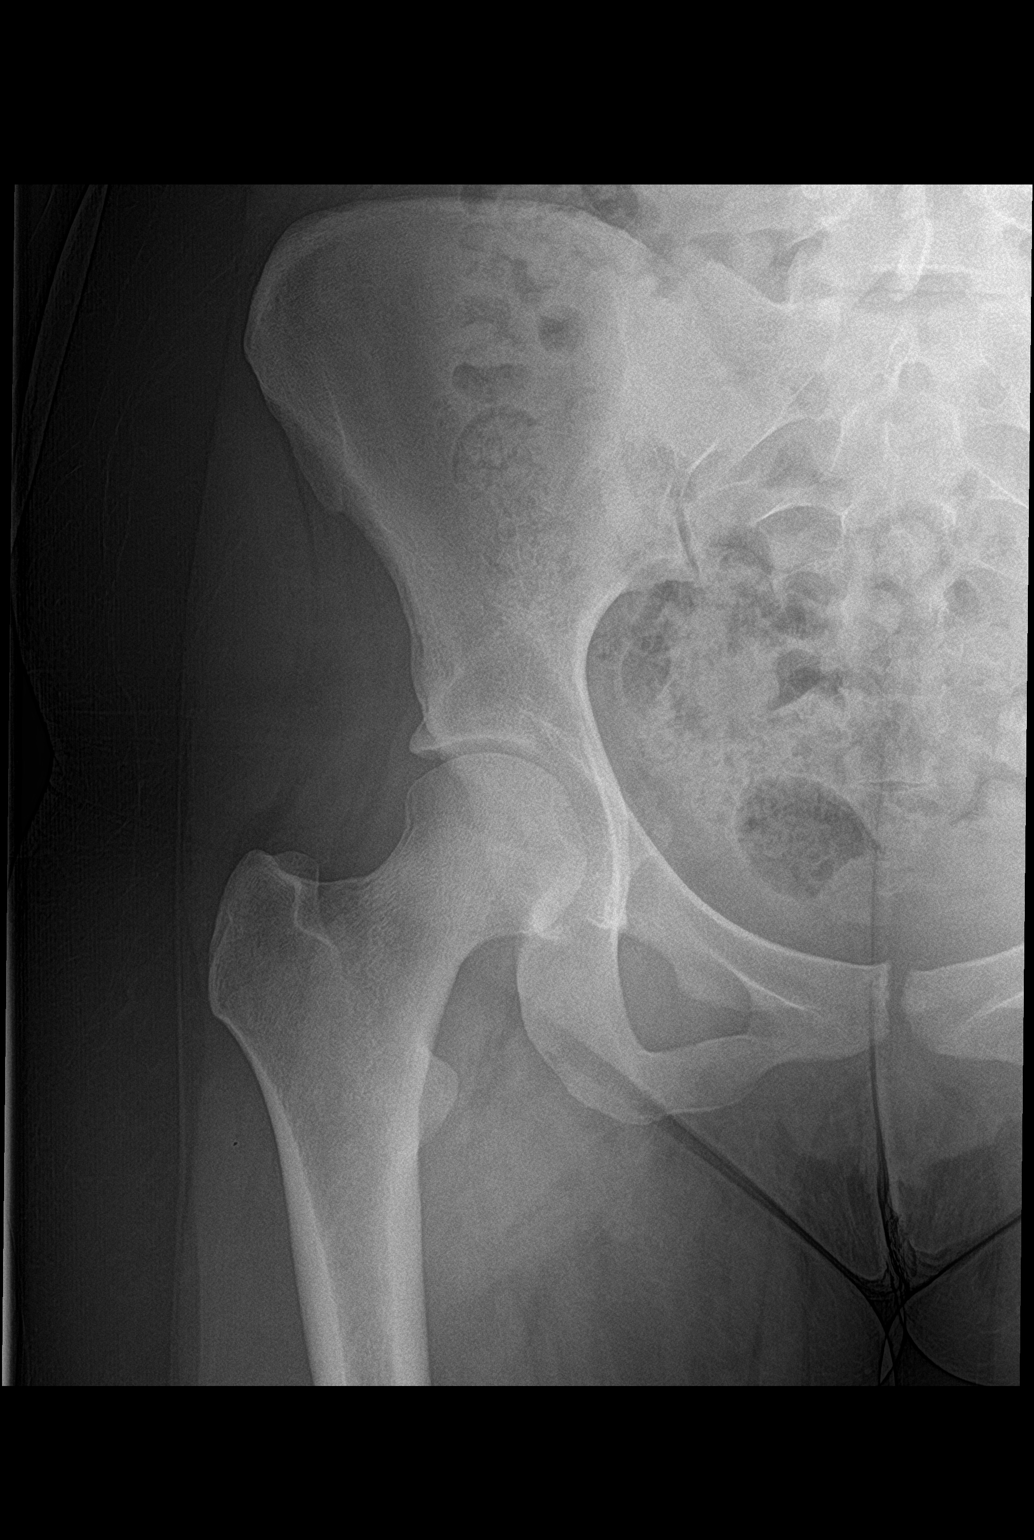

[hip lat]
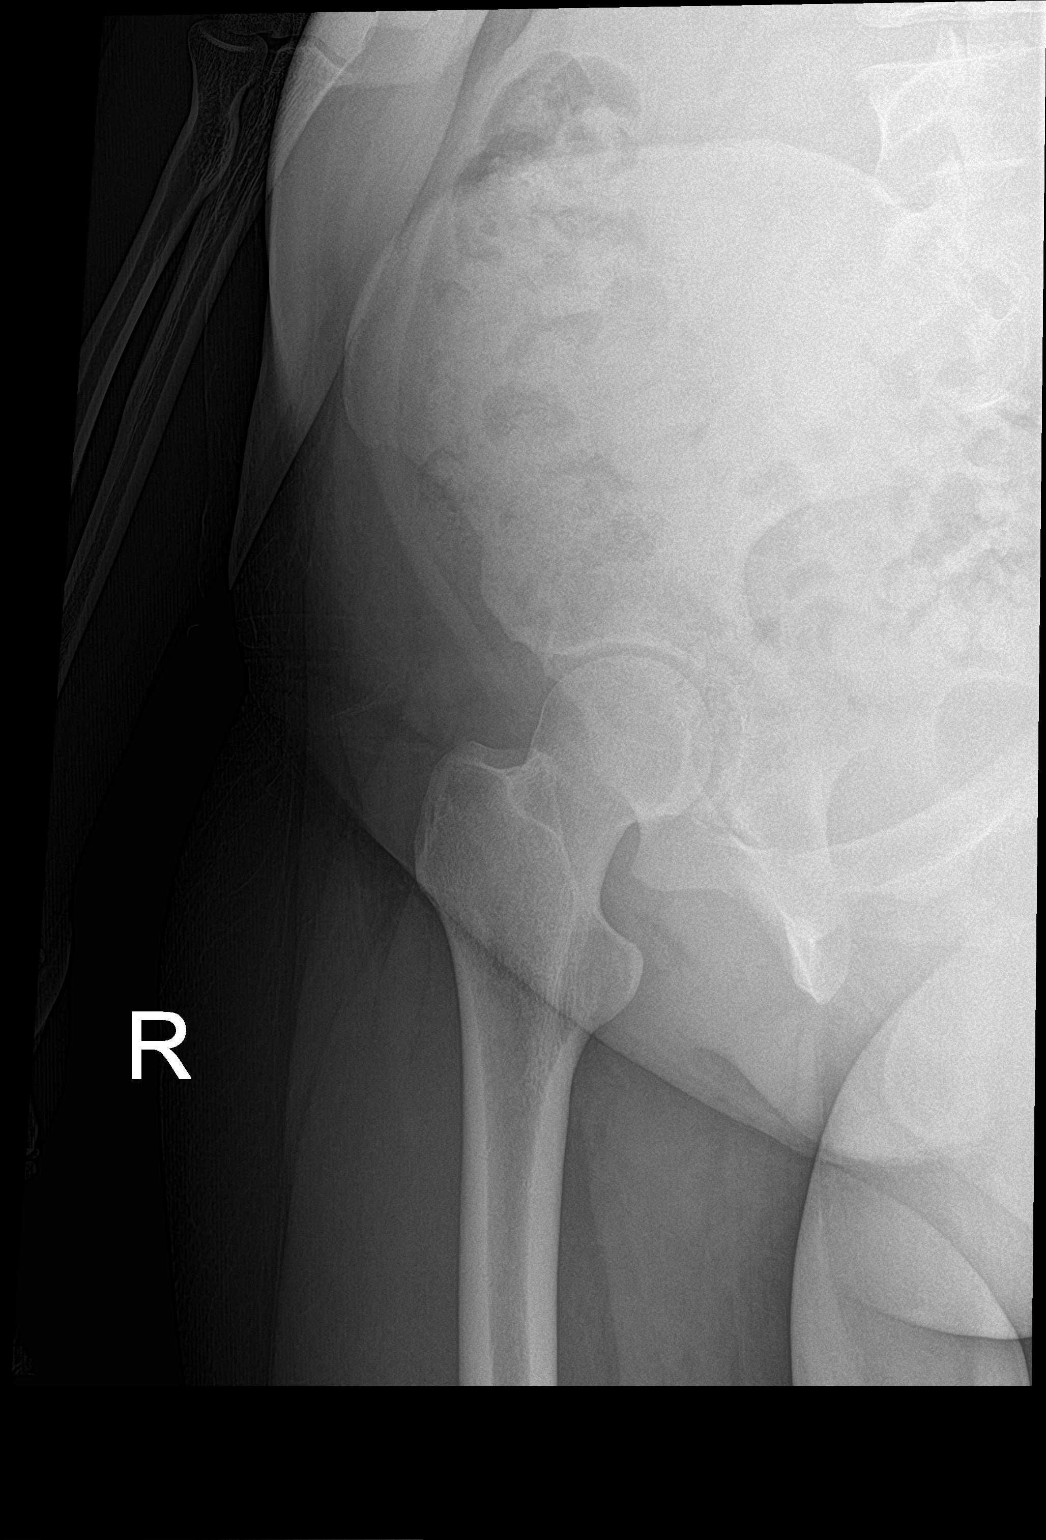

[3 of 3 positions shown; findings below may reference images not displayed]

FINDINGS: No significant joint space narrowing or osteophyte formation. Normal
rounded contour of femoral head with no abnormal attenuation. Pelvic
bones intact.
IMPRESSION: Radiographically negative
